# Patient Record
Sex: Male | Born: 1985 | Race: Black or African American | Hispanic: No | Marital: Single | Smoking: Current every day smoker
Health system: Southern US, Community
[De-identification: ages and names within clinical notes are randomized; demographics above are authoritative.]

---

## 2012-10-11 ENCOUNTER — Encounter (HOSPITAL_COMMUNITY): Payer: Self-pay | Admitting: Emergency Medicine

## 2012-10-11 ENCOUNTER — Emergency Department (HOSPITAL_COMMUNITY)
Admission: EM | Admit: 2012-10-11 | Discharge: 2012-10-11 | Disposition: A | Discharge status: Home / Self Care | Payer: Self-pay | Attending: Emergency Medicine | Admitting: Emergency Medicine

## 2012-10-11 ENCOUNTER — Emergency Department (HOSPITAL_COMMUNITY): Payer: Self-pay

## 2012-10-11 DIAGNOSIS — F17200 Nicotine dependence, unspecified, uncomplicated: Secondary | ICD-10-CM | POA: Insufficient documentation

## 2012-10-11 DIAGNOSIS — L0390 Cellulitis, unspecified: Secondary | ICD-10-CM

## 2012-10-11 DIAGNOSIS — L02619 Cutaneous abscess of unspecified foot: Secondary | ICD-10-CM | POA: Insufficient documentation

## 2012-10-11 DIAGNOSIS — B353 Tinea pedis: Secondary | ICD-10-CM | POA: Insufficient documentation

## 2012-10-11 DIAGNOSIS — L03039 Cellulitis of unspecified toe: Secondary | ICD-10-CM | POA: Insufficient documentation

## 2012-10-11 DIAGNOSIS — L539 Erythematous condition, unspecified: Secondary | ICD-10-CM | POA: Insufficient documentation

## 2012-10-11 DIAGNOSIS — R609 Edema, unspecified: Secondary | ICD-10-CM | POA: Insufficient documentation

## 2012-10-11 MED ORDER — SULFAMETHOXAZOLE-TRIMETHOPRIM 800-160 MG PO TABS: 1.0000 | ORAL_TABLET | Freq: Two times a day (BID) | ORAL | Status: AC

## 2012-10-11 MED ORDER — CLOTRIMAZOLE 1 % EX CREA: TOPICAL_CREAM | CUTANEOUS | Status: AC

## 2012-10-11 MED ORDER — SULFAMETHOXAZOLE-TRIMETHOPRIM 800-160 MG PO TABS: 1.0000 | ORAL_TABLET | Freq: Two times a day (BID) | ORAL | Status: DC

## 2012-10-11 MED ORDER — CLOTRIMAZOLE-BETAMETHASONE 1-0.05 % EX CREA: TOPICAL_CREAM | CUTANEOUS | Status: DC

## 2012-10-11 NOTE — ED Notes (Signed)
Pt complaining of second toe pain since Sunday. States someone told him he may have gout. Denies injury.

## 2012-10-13 NOTE — ED Provider Notes (Signed)
History     CSN: [id number]  Arrival date & time 10/11/12  4098   First MD Initiated Contact with Patient 10/11/12 1013      Chief Complaint  Patient presents with  . Toe Pain    (Consider location/radiation/quality/duration/timing/severity/associated sxs/prior treatment) HPI Comments: Jerry Cross is a 27 y.o. Male presenting with with swelling, redness and pain of his left second toe for the past 2 days.  He denies injury to his toe or foot and has never had symptoms similar to this in the past.  He denies personal or family history of gout.  He has had a rash between his toes which he describes as itching and has been present for at least 2 months, he has used no medications for this rash nor is he tried any medicines or treatments since his toe became painful this week.  Pain is constant, throbbing and does not radiate.  There has been no drainage from the rash.  He denies fevers or chills and he is ambulatory, but has been favoring this foot since the toe became tender.     The history is provided by the patient.    History reviewed. No pertinent past medical history.  History reviewed. No pertinent past surgical history.  No family history on file.  History  Substance Use Topics  . Smoking status: Current Every Day Smoker  . Smokeless tobacco: Not on file  . Alcohol Use: Yes     Comment: occ      Review of Systems  Constitutional: Negative for fever and chills.  HENT: Negative for facial swelling.   Respiratory: Negative for shortness of breath and wheezing.   Musculoskeletal: Positive for arthralgias.  Skin: Positive for color change and rash.  Neurological: Negative for numbness.    Allergies  Review of patient's allergies indicates no known allergies.  Home Medications   Current Outpatient Rx  Name  Route  Sig  Dispense  Refill  . clotrimazole (LOTRIMIN) 1 % cream      Apply to affected area between your toes 2 times daily   30 g   0   .  sulfamethoxazole-trimethoprim (SEPTRA DS) 800-160 MG per tablet   Oral   Take 1 tablet by mouth every 12 (twelve) hours.   10 tablet   0     BP 124/70  Pulse 99  Temp(Src) 99.5 F (37.5 C) (Oral)  Resp 18  Ht 5' (1.524 m)  Wt 130 lb (58.968 kg)  BMI 25.39 kg/m2  SpO2 99%  Physical Exam  Constitutional: He appears well-developed and well-nourished. No distress.  HENT:  Head: Normocephalic.  Neck: Neck supple.  Cardiovascular: Normal rate.   Pulmonary/Chest: Effort normal. He has no wheezes.  Musculoskeletal: Normal range of motion. He exhibits edema and tenderness.  Patient has moderate tinea pedis of his left foot with a deep fissure between his second and third toe.  The second toe itself is fairly edematous, erythematous and tender to palpation.  He has less than 3 second cap refill in the toe.  Dorsalis pedis pulse is intact.  Skin: Rash noted. There is erythema.    ED Course  Procedures (including critical care time)  Labs Reviewed - No data to display No results found.   1. Cellulitis   2. Tinea pedis       MDM  X-rays were reviewed, reviewed with patient and are negative for obvious evidence of osteomyelitis.  No  fracture.    Patient with cellulitis, with  obvious fungal infection.  He was prescribed Lotrimin 1% cream to apply topically.  Also prescribed Bactrim DS for cellulitis.  Encouraged warm soaks several times daily, followed by completely drying his foot in between his toes before reapplying the Lotrimin cream.  Encouraged recheck here if not improving over the next week or if his symptoms worsen, specifically swelling or redness or radiation of pain beyond his toe, fevers or chills.Marland Kitchen  He was also given referrals for obtaining primary care.  Burgess Amor, PA-C 10/13/12 1755

## 2012-10-16 NOTE — ED Provider Notes (Signed)
Medical screening examination/treatment/procedure(s) were performed by non-physician practitioner and as supervising physician I was immediately available for consultation/collaboration. Devoria Albe, MD, Armando Gang   Ward Givens, MD 10/16/12 [phone]

## 2012-11-15 ENCOUNTER — Other Ambulatory Visit: Payer: Self-pay

## 2014-05-28 IMAGING — CR DG TOE 2ND 2+V*L*
2 series · 2 of 2 positions shown · non-contrast
Comparison: None.

CLINICAL DATA: Second toe swelling, no known injury

LEFT SECOND TOE

[view not recorded (1 of 2)]
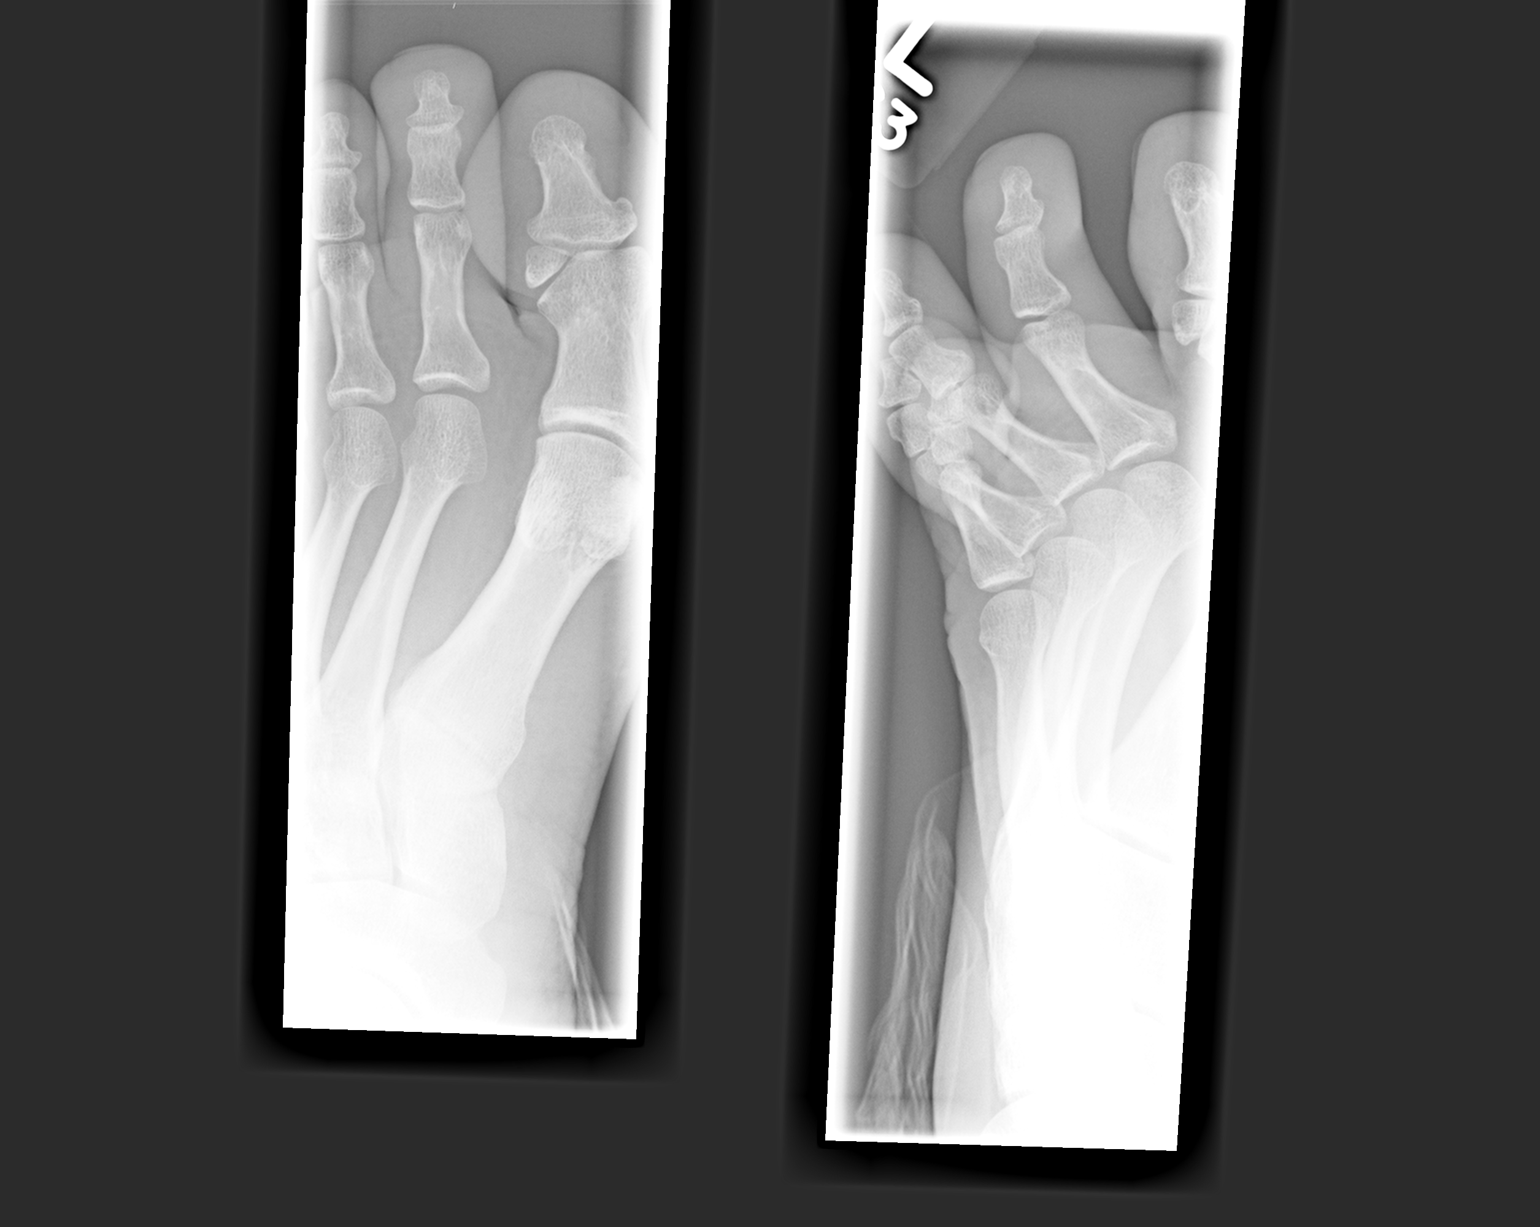

[view not recorded (2 of 2)]
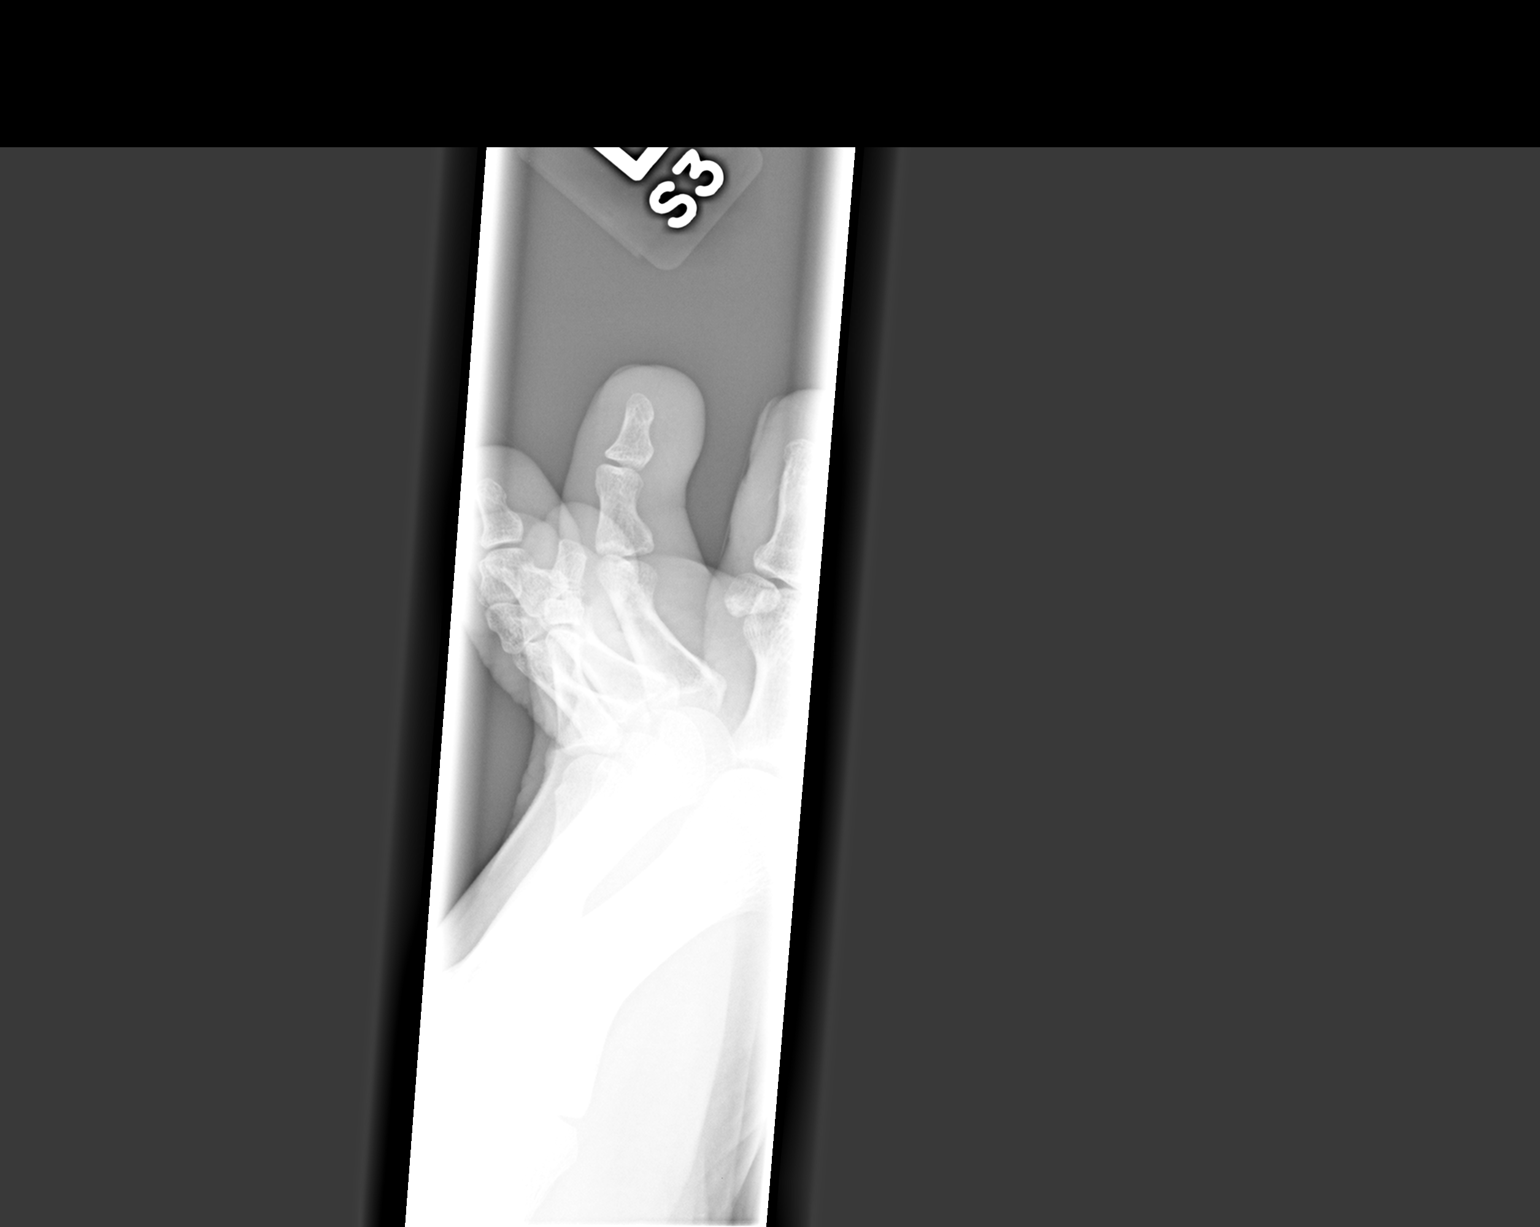

[2 of 2 positions shown; findings below may reference images not displayed]

FINDINGS: Three views of the left second toe submitted.  No acute
fracture or subluxation.  Mild soft tissue swelling.
IMPRESSION: No acute fracture or subluxation.  Mild soft tissue swelling.

## 2018-02-14 ENCOUNTER — Telehealth (INDEPENDENT_AMBULATORY_CARE_PROVIDER_SITE_OTHER): Payer: Self-pay | Admitting: *Deleted

## 2018-02-14 NOTE — Telephone Encounter (Signed)
Opened in error
